# Patient Record
Sex: Female | Born: 1995 | Race: Black or African American | Hispanic: No | Marital: Single | Smoking: Never smoker
Health system: Southern US, Community
[De-identification: ages and names within clinical notes are randomized; demographics above are authoritative.]

## PROBLEM LIST (undated history)

## (undated) DIAGNOSIS — D649 Anemia, unspecified: Secondary | ICD-10-CM

---

## 2017-12-01 ENCOUNTER — Encounter (HOSPITAL_COMMUNITY): Payer: Self-pay | Admitting: Emergency Medicine

## 2017-12-01 ENCOUNTER — Emergency Department (HOSPITAL_COMMUNITY)
Admission: EM | Admit: 2017-12-01 | Discharge: 2017-12-02 | Disposition: A | Discharge status: Home / Self Care | Payer: Medicaid Other | Attending: Emergency Medicine | Admitting: Emergency Medicine

## 2017-12-01 DIAGNOSIS — O26892 Other specified pregnancy related conditions, second trimester: Secondary | ICD-10-CM | POA: Insufficient documentation

## 2017-12-01 DIAGNOSIS — R1030 Lower abdominal pain, unspecified: Secondary | ICD-10-CM | POA: Diagnosis not present

## 2017-12-01 DIAGNOSIS — Z3A17 17 weeks gestation of pregnancy: Secondary | ICD-10-CM | POA: Diagnosis not present

## 2017-12-01 DIAGNOSIS — N898 Other specified noninflammatory disorders of vagina: Secondary | ICD-10-CM | POA: Insufficient documentation

## 2017-12-01 HISTORY — DX: Anemia, unspecified: D64.9

## 2017-12-01 LAB — URINALYSIS, ROUTINE W REFLEX MICROSCOPIC
BILIRUBIN URINE: NEGATIVE
GLUCOSE, UA: NEGATIVE mg/dL
Ketones, ur: NEGATIVE mg/dL
NITRITE: NEGATIVE
PH: 6 (ref 5.0–8.0)
Protein, ur: NEGATIVE mg/dL
SPECIFIC GRAVITY, URINE: 1.014 (ref 1.005–1.030)

## 2017-12-01 LAB — COMPREHENSIVE METABOLIC PANEL
ALK PHOS: 50 U/L (ref 38–126)
ALT: 14 U/L (ref 0–44)
AST: 21 U/L (ref 15–41)
Albumin: 2.9 g/dL — ABNORMAL LOW (ref 3.5–5.0)
Anion gap: 11 (ref 5–15)
BILIRUBIN TOTAL: 0.4 mg/dL (ref 0.3–1.2)
BUN: 7 mg/dL (ref 6–20)
CALCIUM: 8.8 mg/dL — AB (ref 8.9–10.3)
CO2: 18 mmol/L — ABNORMAL LOW (ref 22–32)
CREATININE: 0.58 mg/dL (ref 0.44–1.00)
Chloride: 107 mmol/L (ref 98–111)
GFR calc Af Amer: >60 mL/min (ref >60)
GLUCOSE: 112 mg/dL — AB (ref 70–99)
POTASSIUM: 3.4 mmol/L — AB (ref 3.5–5.1)
Sodium: 136 mmol/L (ref 135–145)
TOTAL PROTEIN: 6.1 g/dL — AB (ref 6.5–8.1)

## 2017-12-01 LAB — CBC
HEMATOCRIT: 34.4 % — AB (ref 36.0–46.0)
Hemoglobin: 10.7 g/dL — ABNORMAL LOW (ref 12.0–15.0)
MCH: 25.8 pg — ABNORMAL LOW (ref 26.0–34.0)
MCHC: 31.1 g/dL (ref 30.0–36.0)
MCV: 82.9 fL (ref 78.0–100.0)
PLATELETS: 246 10*3/uL (ref 150–400)
RBC: 4.15 MIL/uL (ref 3.87–5.11)
RDW: 14.9 % (ref 11.5–15.5)
WBC: 8.3 10*3/uL (ref 4.0–10.5)

## 2017-12-01 LAB — LIPASE, BLOOD: Lipase: 43 U/L (ref 11–51)

## 2017-12-01 LAB — HCG, QUANTITATIVE, PREGNANCY: HCG, BETA CHAIN, QUANT, S: 29405 m[IU]/mL — AB (ref <5)

## 2017-12-01 NOTE — ED Triage Notes (Signed)
Patient reports intermittent central/low abdominal pain with cramping for two weeks , pt. stated pain starts after every sexual encounter , she is approx. [redacted] weeks pregnant G3P2 with no prenatal check up. Denies vaginal spotting or bleeding , occasional vaginal discharge , no fever or chills .

## 2017-12-02 ENCOUNTER — Emergency Department (HOSPITAL_COMMUNITY): Payer: Medicaid Other

## 2017-12-02 LAB — WET PREP, GENITAL
SPERM: NONE SEEN
TRICH WET PREP: NONE SEEN
Yeast Wet Prep HPF POC: NONE SEEN

## 2017-12-02 LAB — HIV ANTIBODY (ROUTINE TESTING W REFLEX): HIV Screen 4th Generation wRfx: NONREACTIVE

## 2017-12-02 LAB — GC/CHLAMYDIA PROBE AMP (~~LOC~~) NOT AT ARMC
Chlamydia: NEGATIVE
NEISSERIA GONORRHEA: NEGATIVE

## 2017-12-02 LAB — RPR: RPR: NONREACTIVE

## 2017-12-02 MED ORDER — METRONIDAZOLE 500 MG PO TABS: 500.0000 mg | ORAL_TABLET | Freq: Two times a day (BID) | ORAL | 0 refills | Status: AC

## 2017-12-02 MED ORDER — NITROFURANTOIN MONOHYD MACRO 100 MG PO CAPS: 100.0000 mg | ORAL_CAPSULE | Freq: Two times a day (BID) | ORAL | 0 refills | Status: AC

## 2017-12-02 MED ORDER — LIDOCAINE HCL (PF) 1 % IJ SOLN
INTRAMUSCULAR | Status: AC
  Administered 2017-12-02: 5 mL
  Filled 2017-12-02: qty 5

## 2017-12-02 MED ORDER — ONDANSETRON 4 MG PO TBDP
4.0000 mg | ORAL_TABLET | Freq: Once | ORAL | Status: AC | PRN
  Administered 2017-12-02: 4 mg via ORAL
  Filled 2017-12-02: qty 1

## 2017-12-02 MED ORDER — AZITHROMYCIN 250 MG PO TABS
1000.0000 mg | ORAL_TABLET | Freq: Once | ORAL | Status: AC
  Administered 2017-12-02: 1000 mg via ORAL
  Filled 2017-12-02: qty 4

## 2017-12-02 MED ORDER — CEFTRIAXONE SODIUM 250 MG IJ SOLR
250.0000 mg | Freq: Once | INTRAMUSCULAR | Status: AC
  Administered 2017-12-02: 250 mg via INTRAMUSCULAR
  Filled 2017-12-02: qty 250

## 2017-12-02 NOTE — ED Notes (Signed)
Spoke with Aurea GraffJoan in microbiology to inquire about urine culture specimen, per Aurea GraffJoan no culture tube collected. Will collect.

## 2017-12-02 NOTE — ED Notes (Signed)
Pt refused discharge vitals 

## 2017-12-02 NOTE — Discharge Instructions (Addendum)
You are approximately [redacted] weeks pregnant.  Baby is doing well on ultrasound.  You need to start taking prenatal vitamins.  Important for you to follow-up with the OB/GYN doctor.  You have been diagnosed with a urinary tract infection.  Take the Macrobid which will help with this infection.  I also treated you for an STD these cultures are pending and will you be notified if they are positive.  Have also given you Flagyl for bacterial vaginosis.  May take Tylenol for pain.  Follow-up with your primary care doctor and your OB/GYN doctor.  Return to the ED with any worsening symptoms.

## 2017-12-02 NOTE — ED Provider Notes (Signed)
[facility] EMERGENCY DEPARTMENT Provider Note   CSN: [id number] Arrival date & time: 12/01/17  2158     History   Chief Complaint Chief Complaint  Patient presents with  . Abdominal Pain    HPI Kathryn Abbott is a 22 y.o. female.  HPI 22 year old African-American female who is G3, P2 presents to the emergency department today for evaluation of intermittent abdominal pain and vaginal discharge.  Patient states that she is approximately [redacted] weeks pregnant.  She has not OB/GYN for her pregnancy.  She reports for the past 2 weeks she has been having intermittent abdominal discomfort that is lower she describes as cramping in nature after sex.  Patient states that otherwise she has no pain.  She does report some vaginal discharge.  She is sexually active with one female partner does not use protection but has no concern for STD at this time.  She denies any vaginal bleeding.  She denies any urinary symptoms.  Patient is concerned she may have a yeast infection.  She has not taken anything for her symptoms.  Nothing makes better.  Pt denies any fever, chill, ha, vision changes, lightheadedness, dizziness, congestion, neck pain, cp, sob, cough, n/v/d, urinary symptoms, change in bowel habits, melena, hematochezia, lower extremity paresthesias.  Past Medical History:  Diagnosis Date  . Anemia     There are no active problems to display for this patient.   History reviewed. No pertinent surgical history.   OB History    Gravida  1   Para      Term      Preterm      AB      Living        SAB      TAB      Ectopic      Multiple      Live Births               Home Medications    Prior to Admission medications   Medication Sig Start Date End Date Taking? Authorizing Provider  metroNIDAZOLE (FLAGYL) 500 MG tablet Take 1 tablet (500 mg total) by mouth 2 (two) times daily with a meal. DO NOT CONSUME ALCOHOL WHILE TAKING THIS MEDICATION. 12/02/17    Rise Mu, PA-C  nitrofurantoin, macrocrystal-monohydrate, (MACROBID) 100 MG capsule Take 1 capsule (100 mg total) by mouth 2 (two) times daily. 12/02/17   Rise Mu, PA-C    Family History No family history on file.  Social History Social History   Tobacco Use  . Smoking status: Never Smoker  . Smokeless tobacco: Never Used  Substance Use Topics  . Alcohol use: Not on file  . Drug use: Not on file     Allergies   Patient has no known allergies.   Review of Systems Review of Systems  All other systems reviewed and are negative.    Physical Exam Updated Vital Signs BP 109/64   Pulse 77   Temp 98.2 F (36.8 C) (Oral)   Resp 16   Ht 5\' 5"  (1.651 m)   Wt 51.3 kg (113 lb)   LMP 08/01/2017 (Approximate)   SpO2 100%   BMI 18.80 kg/m   Physical Exam  Constitutional: She is oriented to person, place, and time. She appears well-developed and well-nourished.  Non-toxic appearance. No distress.  HENT:  Head: Normocephalic and atraumatic.  Nose: Nose normal.  Mouth/Throat: Oropharynx is clear and moist.  Eyes: Pupils are equal, round, and reactive  to light. Conjunctivae are normal. Right eye exhibits no discharge. Left eye exhibits no discharge.  Neck: Normal range of motion. Neck supple.  Cardiovascular: Normal rate, regular rhythm, normal heart sounds and intact distal pulses.  Pulmonary/Chest: Effort normal and breath sounds normal. No respiratory distress. She exhibits no tenderness.  Abdominal: Soft. Normal appearance and bowel sounds are normal. There is tenderness in the suprapubic area. There is no rigidity, no rebound, no guarding, no CVA tenderness, no tenderness at McBurney's point and negative Murphy's sign.  Genitourinary:  Genitourinary Comments: Chaperone present for exam. No external lesions, swelling, erythema, or rash of the labia. No erythema, , bleeding, or lesions noted in the vaginal vault.  Thick green discharge noted on exam.  No CMT  tenderness, bleeding or friability. No adnexal tenderness, mass or fullness bilaterally. No inguinal adenopathy or hernia.    Musculoskeletal: Normal range of motion. She exhibits no tenderness.  Lymphadenopathy:    She has no cervical adenopathy.  Neurological: She is alert and oriented to person, place, and time.  Skin: Skin is warm and dry. Capillary refill takes less than 2 seconds.  Psychiatric: Her behavior is normal. Judgment and thought content normal.  Nursing note and vitals reviewed.    ED Treatments / Results  Labs (all labs ordered are listed, but only abnormal results are displayed) Labs Reviewed  WET PREP, GENITAL - Abnormal; Notable for the following components:      Result Value   Clue Cells Wet Prep HPF POC PRESENT (*)    WBC, Wet Prep HPF POC MANY (*)    All other components within normal limits  COMPREHENSIVE METABOLIC PANEL - Abnormal; Notable for the following components:   Potassium 3.4 (*)    CO2 18 (*)    Glucose, Bld 112 (*)    Calcium 8.8 (*)    Total Protein 6.1 (*)    Albumin 2.9 (*)    All other components within normal limits  CBC - Abnormal; Notable for the following components:   Hemoglobin 10.7 (*)    HCT 34.4 (*)    MCH 25.8 (*)    All other components within normal limits  URINALYSIS, ROUTINE W REFLEX MICROSCOPIC - Abnormal; Notable for the following components:   APPearance HAZY (*)    Hgb urine dipstick SMALL (*)    Leukocytes, UA MODERATE (*)    Bacteria, UA RARE (*)    All other components within normal limits  HCG, QUANTITATIVE, PREGNANCY - Abnormal; Notable for the following components:   hCG, Beta Chain, Quant, S 29,405 (*)    All other components within normal limits  URINE CULTURE  LIPASE, BLOOD  RPR  HIV ANTIBODY (ROUTINE TESTING)  GC/CHLAMYDIA PROBE AMP ([place]    EKG None  Radiology [place] Ob Limited > 14 Wks  Result Date: 12/02/2017 CLINICAL DATA:  Pregnant patient in second trimester pregnancy with  lower abdominal pain and vaginal discharge. No prenatal care. EXAM: LIMITED OBSTETRIC ULTRASOUND AN DOPPLER FINDINGS: Number of Fetuses: 1 Heart Rate:  145 bpm Movement: Yes Presentation: Breech Placental Location: Posterior Previa: No. Amniotic Fluid (Subjective):  Within normal limits. BPD:  3.7cm 17w 1d MATERNAL FINDINGS: Cervix:  Appears closed. Uterus/Adnexae: Technologist states focal contraction at start of exam that resolved completion imaging. The right ovary measures 4.3 x 2.8 x 3.8 cm. Normal blood flow. Small follicular cyst measures 2 cm. The left ovary measures 3.1 x 1.5 x 2.1 cm. Normal blood flow. Pulsed Doppler evaluation of  both ovaries demonstrates normal low-resistance arterial and venous waveforms. Other findings No free fluid.  No free fluid. IMPRESSION: 1. Single live intrauterine pregnancy estimated gestational age [redacted] weeks 1 day for [facility] 05/11/2018. No placenta previa. 2. Normal sonographic appearance of the ovaries without torsion. This exam is performed on an emergent basis and does not comprehensively evaluate fetal size, dating, or anatomy; follow-up complete OB US should be considered if further fetal assessment is warranted. Electronically Signed   By: Rubye Oaks M.D.   On: 12/02/2017 03:00   [place] Art/ven Flow Abd Pelv Doppler  Result Date: 12/02/2017 CLINICAL DATA:  Pregnant patient in second trimester pregnancy with lower abdominal pain and vaginal discharge. No prenatal care. EXAM: LIMITED OBSTETRIC ULTRASOUND AN DOPPLER FINDINGS: Number of Fetuses: 1 Heart Rate:  145 bpm Movement: Yes Presentation: Breech Placental Location: Posterior Previa: No. Amniotic Fluid (Subjective):  Within normal limits. BPD:  3.7cm 17w 1d MATERNAL FINDINGS: Cervix:  Appears closed. Uterus/Adnexae: Technologist states focal contraction at start of exam that resolved completion imaging. The right ovary measures 4.3 x 2.8 x 3.8 cm. Normal blood flow. Small follicular cyst measures 2 cm. The left ovary  measures 3.1 x 1.5 x 2.1 cm. Normal blood flow. Pulsed Doppler evaluation of both ovaries demonstrates normal low-resistance arterial and venous waveforms. Other findings No free fluid.  No free fluid. IMPRESSION: 1. Single live intrauterine pregnancy estimated gestational age [redacted] weeks 1 day for [facility] 05/11/2018. No placenta previa. 2. Normal sonographic appearance of the ovaries without torsion. This exam is performed on an emergent basis and does not comprehensively evaluate fetal size, dating, or anatomy; follow-up complete OB US should be considered if further fetal assessment is warranted. Electronically Signed   By: Rubye Oaks M.D.   On: 12/02/2017 03:00    Procedures Procedures (including critical care time)  Medications Ordered in ED Medications  cefTRIAXone (ROCEPHIN) injection 250 mg (250 mg Intramuscular Given 12/02/17 0352)  azithromycin (ZITHROMAX) tablet 1,000 mg (1,000 mg Oral Given 12/02/17 0351)  lidocaine (PF) (XYLOCAINE) 1 % injection (5 mLs  Given 12/02/17 0352)  ondansetron (ZOFRAN-ODT) disintegrating tablet 4 mg (4 mg Oral Given 12/02/17 0507)     Initial Impression / Assessment and Plan / ED Course  I have reviewed the triage vital signs and the nursing notes.  Pertinent labs & imaging results that were available during my care of the patient were reviewed by me and considered in my medical decision making (see chart for details).     Patient presents to the emergency department today for evaluation of vaginal discharge and some lower abdominal pain that is been intermittent after sexual intercourse.  Denies any vaginal bleeding.  Department [redacted] weeks pregnant but has not received prenatal care.  Vital signs are reassuring.  On exam patient has minimal tenderness palpation of the suprapubic region.  Bowel sounds present in all 4 quadrants.  Vaginal discharge noted on exam without any cervical motion tenderness.  Lab work shows no leukocytosis.  No significant electrolyte  derangement.  Mild anemia of 10.7 noted.  Lipase was normal.  UA shows moderate leukocytes, RBCs and rare bacteria and is nitrite negative.  Wet prep shows clue cells and WBCs.  Quant was 29,000.  Urine culture, RPR, gonorrhea and chlamydia and HIV are pending.  Sound shows viable pregnancy at 17 weeks without any signs of placenta previa.  No ovarian torsion noted.  Low suspicion for appendicitis, diverticulitis, bowel obstruction, cholecystitis.  No signs of ectopic pregnancy or PID.  Will treat for urinary tract infection with Macrobid given that I have low suspicion for pyelonephritis.  We will also treat with ceftriaxone and azithromycin given the vaginal discharge with many WBCs.  We will also give Flagyl for BV.  I did discuss the case with Dr. Lynetta Mare with OB/GYN who is agreed with these medications.  I have encouraged patient to follow-up with an OB/GYN doctor the next 1 to 2 weeks and to start taking prenatal vitamins.  Pt is hemodynamically stable, in NAD, & able to ambulate in the ED. Evaluation does not show pathology that would require ongoing emergent intervention or inpatient treatment. I explained the diagnosis to the patient. Pain has been managed & has no complaints prior to dc. Pt is comfortable with above plan and is stable for discharge at this time. All questions were answered prior to disposition. Strict return precautions for f/u to the ED were discussed. Encouraged follow up with PCP.   Final Clinical Impressions(s) / ED Diagnoses   Final diagnoses:  Lower abdominal pain  Vaginal discharge during pregnancy in second trimester    ED Discharge Orders        Ordered    metroNIDAZOLE (FLAGYL) 500 MG tablet  2 times daily with meals     12/02/17 0452    nitrofurantoin, macrocrystal-monohydrate, (MACROBID) 100 MG capsule  2 times daily     12/02/17 0452       Rise Mu, PA-C 12/02/17 1610    Zadie Rhine, MD 12/02/17 [phone]

## 2017-12-03 LAB — URINE CULTURE

## 2018-09-29 ENCOUNTER — Ambulatory Visit (RURAL_HEALTH_CENTER): Payer: 59 | Admitting: Obstetrics & Gynecology

## 2018-10-02 ENCOUNTER — Telehealth (RURAL_HEALTH_CENTER): Payer: Self-pay | Admitting: Obstetrics & Gynecology

## 2018-10-02 ENCOUNTER — Encounter (RURAL_HEALTH_CENTER): Payer: Self-pay | Admitting: Obstetrics & Gynecology

## 2018-10-02 NOTE — Telephone Encounter (Signed)
Left vm mailed nsl

## 2018-11-26 IMAGING — US US OB LIMITED
1 series · 13 of 28 positions shown · non-contrast
Comparison: none

CLINICAL DATA: Pregnant patient in second trimester pregnancy with
lower abdominal pain and vaginal discharge. No prenatal care.

EXAM:
LIMITED OBSTETRIC ULTRASOUND AN DOPPLER

[Series 1: us ob limited · 0.26mm/px · 42 acquisitions, 13 frames shown]
[im 2/42]
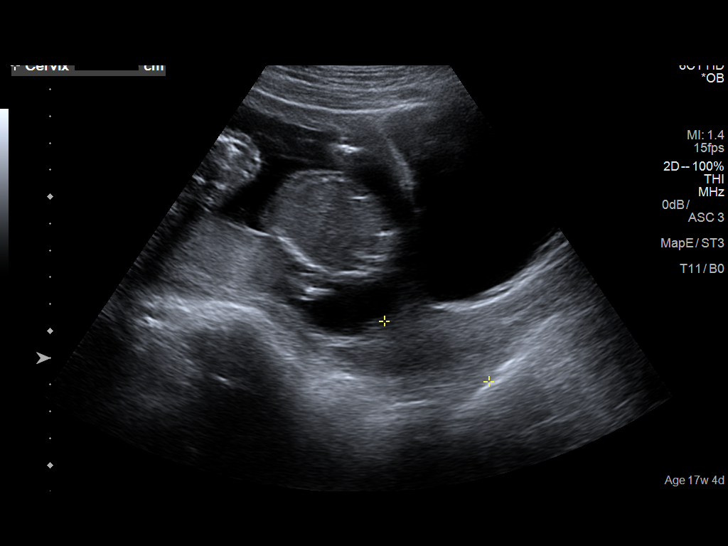
[im 5/42]
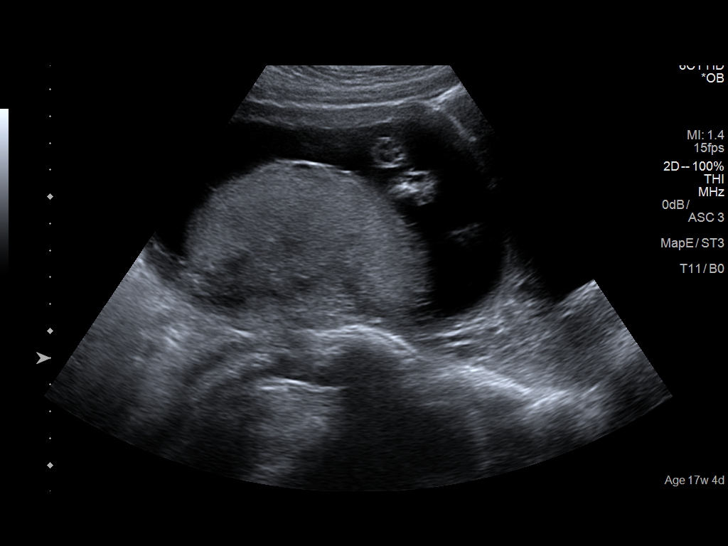
[im 8/42]
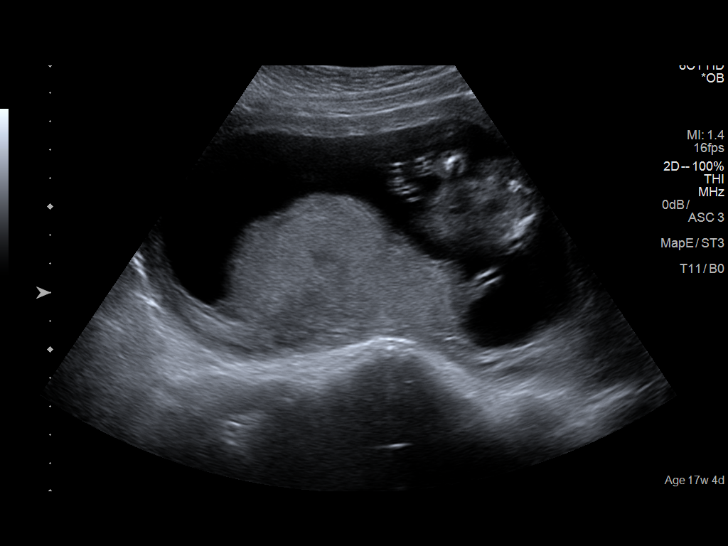
[im 11/42]
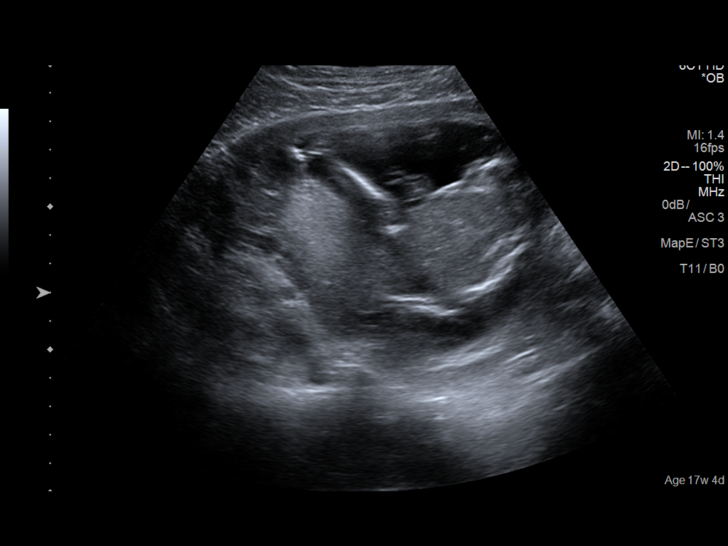
[im 14/42]
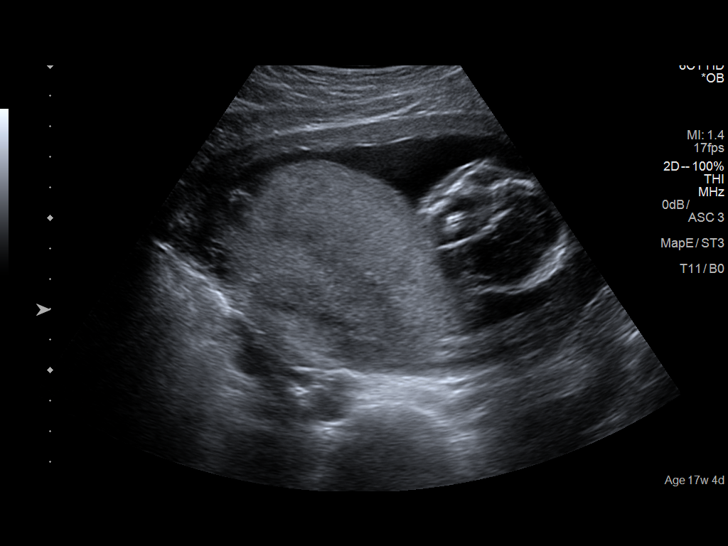
[im 17/42]
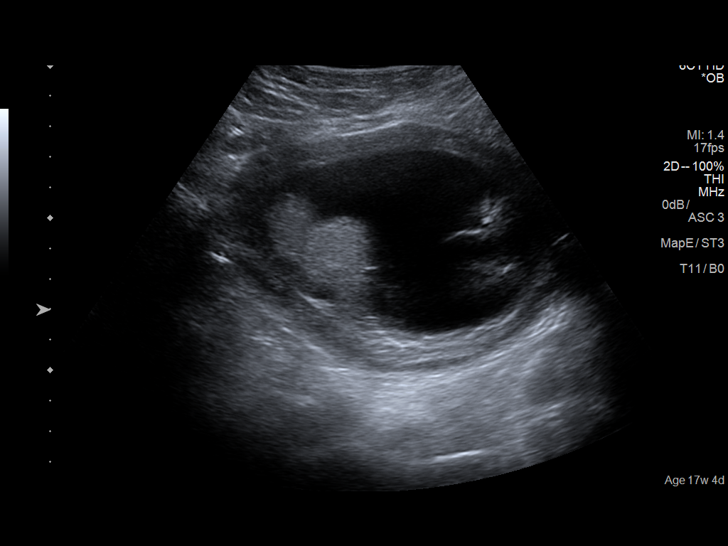
[im 22/42]
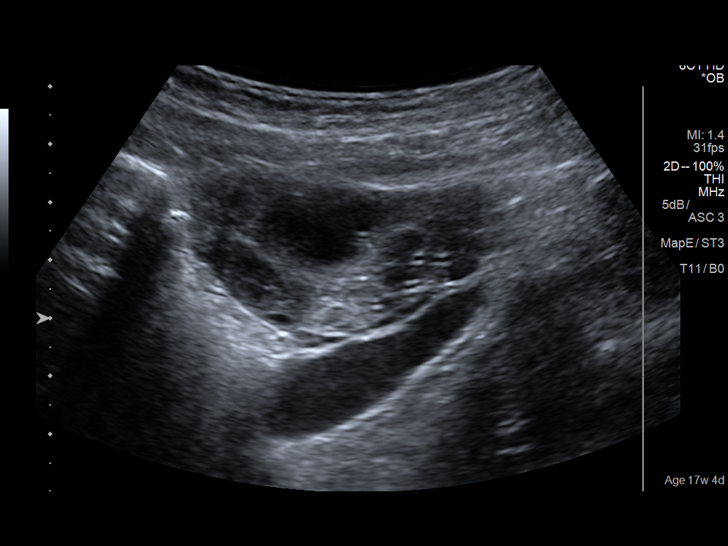
[im 25/42]
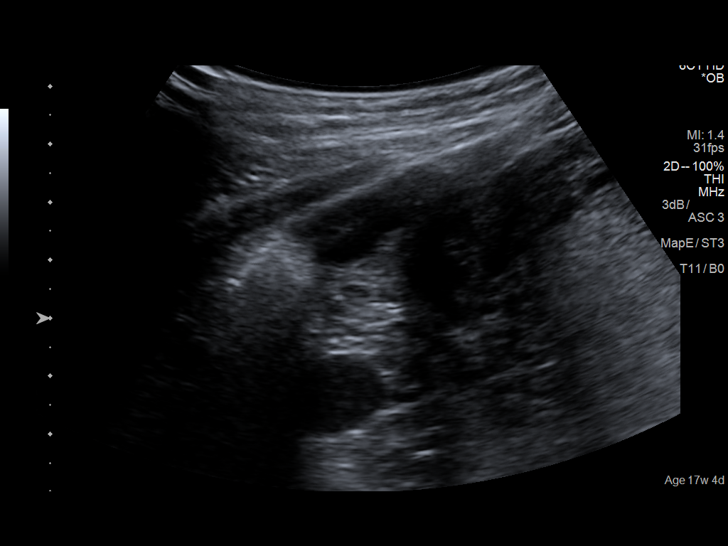
[im 28/42]
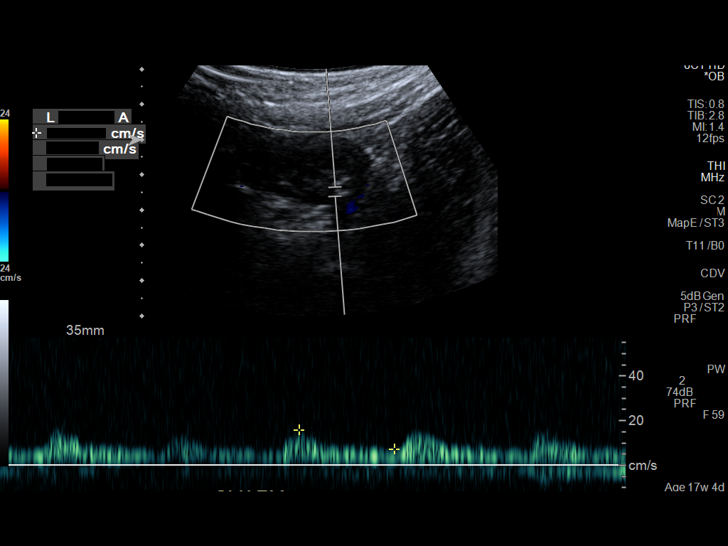
[im 31/42]
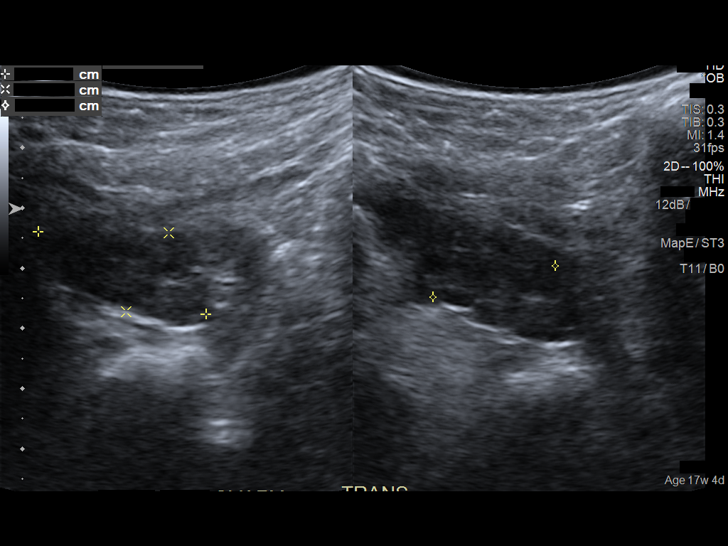
[im 34/42]
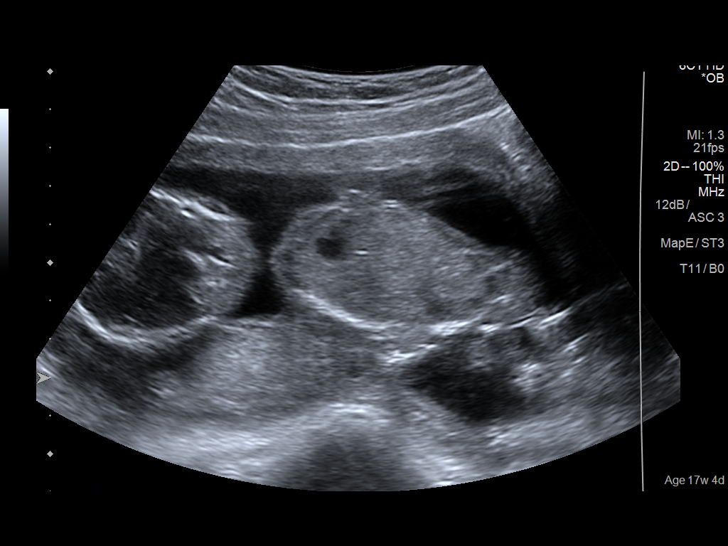
[im 37/42]
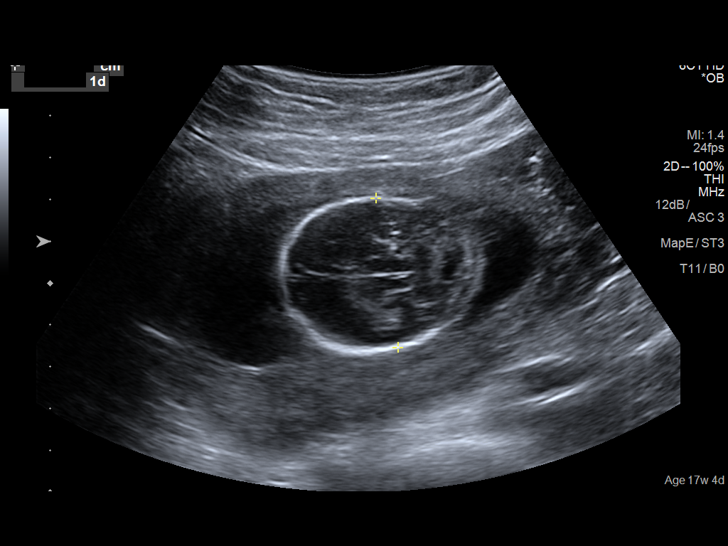
[im 40/42]
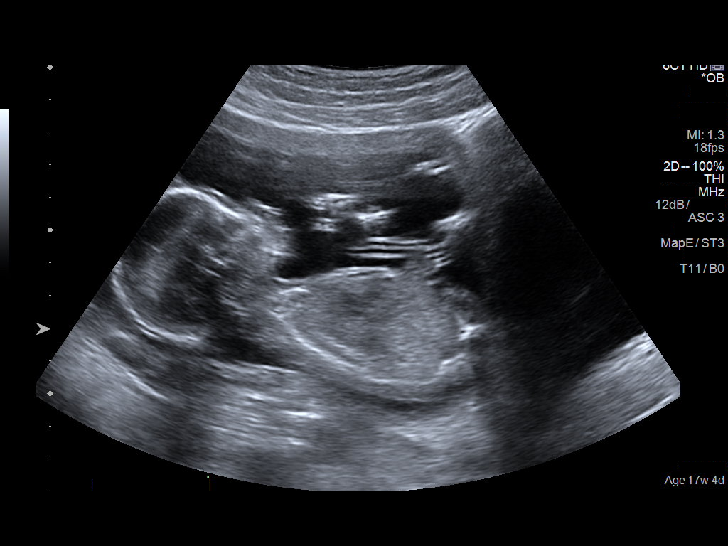

[13 of 28 positions shown; findings below may reference images not displayed]

FINDINGS: Number of Fetuses: 1

Heart Rate:  145 bpm

Movement: Yes

Presentation: Breech

Placental Location: Posterior

Previa: No.

Amniotic Fluid (Subjective):  Within normal limits.

BPD:  3.7cm 17w 1d

MATERNAL FINDINGS:

Cervix:  Appears closed.

Uterus/Adnexae: Technologist states focal contraction at start of
exam that resolved completion imaging. The right ovary measures
x 2.8 x 3.8 cm. Normal blood flow. Small follicular cyst measures 2
cm. The left ovary measures 3.1 x 1.5 x 2.1 cm. Normal blood flow.

Pulsed Doppler evaluation of both ovaries demonstrates normal
low-resistance arterial and venous waveforms.

Other findings

No free fluid.  No free fluid.
IMPRESSION: 1. Single live intrauterine pregnancy estimated gestational age 17
weeks 1 day for [facility] 05/11/2018. No placenta previa.
2. Normal sonographic appearance of the ovaries without torsion.

This exam is performed on an emergent basis and does not
comprehensively evaluate fetal size, dating, or anatomy; follow-up
complete OB US should be considered if further fetal assessment is
warranted.
